# Patient Record
Sex: Female | Born: 1950 | Race: Black or African American | Hispanic: No | Smoking: Never smoker
Health system: Southern US, Community
[De-identification: ages and names within clinical notes are randomized; demographics above are authoritative.]

## PROBLEM LIST (undated history)

## (undated) DIAGNOSIS — I10 Essential (primary) hypertension: Secondary | ICD-10-CM

---

## 2006-10-20 DIAGNOSIS — K589 Irritable bowel syndrome without diarrhea: Secondary | ICD-10-CM | POA: Insufficient documentation

## 2006-10-20 HISTORY — DX: Irritable bowel syndrome without diarrhea: K58.9

## 2008-03-25 DIAGNOSIS — M545 Low back pain, unspecified: Secondary | ICD-10-CM

## 2008-03-25 HISTORY — DX: Low back pain, unspecified: M54.50

## 2008-11-12 DIAGNOSIS — K219 Gastro-esophageal reflux disease without esophagitis: Secondary | ICD-10-CM | POA: Diagnosis present

## 2008-11-12 HISTORY — DX: Gastro-esophageal reflux disease without esophagitis: K21.9

## 2009-11-17 DIAGNOSIS — M5126 Other intervertebral disc displacement, lumbar region: Secondary | ICD-10-CM | POA: Insufficient documentation

## 2009-12-25 DIAGNOSIS — K449 Diaphragmatic hernia without obstruction or gangrene: Secondary | ICD-10-CM | POA: Insufficient documentation

## 2009-12-25 HISTORY — DX: Diaphragmatic hernia without obstruction or gangrene: K44.9

## 2010-09-23 DIAGNOSIS — I10 Essential (primary) hypertension: Secondary | ICD-10-CM | POA: Diagnosis present

## 2010-09-23 HISTORY — DX: Essential (primary) hypertension: I10

## 2012-07-11 DIAGNOSIS — G4733 Obstructive sleep apnea (adult) (pediatric): Secondary | ICD-10-CM | POA: Diagnosis present

## 2012-07-11 HISTORY — DX: Obstructive sleep apnea (adult) (pediatric): G47.33

## 2012-08-23 DEATH — deceased

## 2013-10-30 DIAGNOSIS — R7303 Prediabetes: Secondary | ICD-10-CM | POA: Insufficient documentation

## 2013-10-30 DIAGNOSIS — R768 Other specified abnormal immunological findings in serum: Secondary | ICD-10-CM

## 2013-10-30 HISTORY — DX: Prediabetes: R73.03

## 2013-10-30 HISTORY — DX: Other specified abnormal immunological findings in serum: R76.8

## 2014-07-23 DIAGNOSIS — K3189 Other diseases of stomach and duodenum: Secondary | ICD-10-CM | POA: Insufficient documentation

## 2014-07-23 HISTORY — DX: Other diseases of stomach and duodenum: K31.89

## 2022-01-26 ENCOUNTER — Emergency Department (HOSPITAL_COMMUNITY): Payer: Medicare HMO

## 2022-01-26 ENCOUNTER — Encounter (HOSPITAL_COMMUNITY): Payer: Self-pay

## 2022-01-26 ENCOUNTER — Observation Stay (HOSPITAL_COMMUNITY)
Admission: EM | Admit: 2022-01-26 | Discharge: 2022-01-27 | Disposition: A | Discharge status: Home / Self Care | Payer: Medicare HMO | Attending: Internal Medicine | Admitting: Internal Medicine

## 2022-01-26 ENCOUNTER — Other Ambulatory Visit: Payer: Self-pay

## 2022-01-26 DIAGNOSIS — R291 Meningismus: Secondary | ICD-10-CM | POA: Diagnosis not present

## 2022-01-26 DIAGNOSIS — R058 Other specified cough: Secondary | ICD-10-CM | POA: Diagnosis not present

## 2022-01-26 DIAGNOSIS — I10 Essential (primary) hypertension: Secondary | ICD-10-CM | POA: Diagnosis not present

## 2022-01-26 DIAGNOSIS — K219 Gastro-esophageal reflux disease without esophagitis: Secondary | ICD-10-CM

## 2022-01-26 DIAGNOSIS — R519 Headache, unspecified: Secondary | ICD-10-CM | POA: Diagnosis present

## 2022-01-26 DIAGNOSIS — M5382 Other specified dorsopathies, cervical region: Secondary | ICD-10-CM | POA: Insufficient documentation

## 2022-01-26 DIAGNOSIS — E782 Mixed hyperlipidemia: Secondary | ICD-10-CM

## 2022-01-26 DIAGNOSIS — A419 Sepsis, unspecified organism: Secondary | ICD-10-CM | POA: Diagnosis not present

## 2022-01-26 DIAGNOSIS — R059 Cough, unspecified: Secondary | ICD-10-CM | POA: Insufficient documentation

## 2022-01-26 DIAGNOSIS — U071 COVID-19: Secondary | ICD-10-CM | POA: Diagnosis not present

## 2022-01-26 DIAGNOSIS — Z79899 Other long term (current) drug therapy: Secondary | ICD-10-CM | POA: Diagnosis not present

## 2022-01-26 DIAGNOSIS — G4733 Obstructive sleep apnea (adult) (pediatric): Secondary | ICD-10-CM

## 2022-01-26 DIAGNOSIS — R509 Fever, unspecified: Secondary | ICD-10-CM

## 2022-01-26 DIAGNOSIS — M436 Torticollis: Secondary | ICD-10-CM

## 2022-01-26 DIAGNOSIS — E876 Hypokalemia: Secondary | ICD-10-CM | POA: Diagnosis not present

## 2022-01-26 DIAGNOSIS — T464X5A Adverse effect of angiotensin-converting-enzyme inhibitors, initial encounter: Secondary | ICD-10-CM

## 2022-01-26 HISTORY — DX: Mixed hyperlipidemia: E78.2

## 2022-01-26 HISTORY — DX: Essential (primary) hypertension: I10

## 2022-01-26 LAB — URINALYSIS, ROUTINE W REFLEX MICROSCOPIC
Bacteria, UA: NONE SEEN
Bilirubin Urine: NEGATIVE
Glucose, UA: NEGATIVE mg/dL
Ketones, ur: NEGATIVE mg/dL
Leukocytes,Ua: NEGATIVE
Nitrite: NEGATIVE
Protein, ur: NEGATIVE mg/dL
Specific Gravity, Urine: 1.031 — ABNORMAL HIGH (ref 1.005–1.030)
pH: 5 (ref 5.0–8.0)

## 2022-01-26 LAB — APTT: aPTT: 32 seconds (ref 24–36)

## 2022-01-26 LAB — CBC WITH DIFFERENTIAL/PLATELET
Abs Immature Granulocytes: 0.03 10*3/uL (ref 0.00–0.07)
Basophils Absolute: 0 10*3/uL (ref 0.0–0.1)
Basophils Relative: 0 %
Eosinophils Absolute: 0 10*3/uL (ref 0.0–0.5)
Eosinophils Relative: 0 %
HCT: 39.5 % (ref 36.0–46.0)
Hemoglobin: 12.4 g/dL (ref 12.0–15.0)
Immature Granulocytes: 0 %
Lymphocytes Relative: 8 %
Lymphs Abs: 0.6 10*3/uL — ABNORMAL LOW (ref 0.7–4.0)
MCH: 26.7 pg (ref 26.0–34.0)
MCHC: 31.4 g/dL (ref 30.0–36.0)
MCV: 85.1 fL (ref 80.0–100.0)
Monocytes Absolute: 0.7 10*3/uL (ref 0.1–1.0)
Monocytes Relative: 10 %
Neutro Abs: 6.1 10*3/uL (ref 1.7–7.7)
Neutrophils Relative %: 82 %
Platelets: 221 10*3/uL (ref 150–400)
RBC: 4.64 MIL/uL (ref 3.87–5.11)
RDW: 14.5 % (ref 11.5–15.5)
WBC: 7.4 10*3/uL (ref 4.0–10.5)
nRBC: 0 % (ref 0.0–0.2)

## 2022-01-26 LAB — COMPREHENSIVE METABOLIC PANEL
ALT: 10 U/L (ref 0–44)
AST: 19 U/L (ref 15–41)
Albumin: 4 g/dL (ref 3.5–5.0)
Alkaline Phosphatase: 68 U/L (ref 38–126)
Anion gap: 12 (ref 5–15)
BUN: 8 mg/dL (ref 8–23)
CO2: 22 mmol/L (ref 22–32)
Calcium: 9.2 mg/dL (ref 8.9–10.3)
Chloride: 104 mmol/L (ref 98–111)
Creatinine, Ser: 0.97 mg/dL (ref 0.44–1.00)
GFR, Estimated: >60 mL/min (ref >60)
Glucose, Bld: 91 mg/dL (ref 70–99)
Potassium: 3.4 mmol/L — ABNORMAL LOW (ref 3.5–5.1)
Sodium: 138 mmol/L (ref 135–145)
Total Bilirubin: 0.9 mg/dL (ref 0.3–1.2)
Total Protein: 7.2 g/dL (ref 6.5–8.1)

## 2022-01-26 LAB — PROTIME-INR
INR: 1 (ref 0.8–1.2)
Prothrombin Time: 13 seconds (ref 11.4–15.2)

## 2022-01-26 LAB — LACTIC ACID, PLASMA: Lactic Acid, Venous: 1.8 mmol/L (ref 0.5–1.9)

## 2022-01-26 LAB — D-DIMER, QUANTITATIVE: D-Dimer, Quant: 0.9 ug/mL-FEU — ABNORMAL HIGH (ref 0.00–0.50)

## 2022-01-26 MED ORDER — POTASSIUM CHLORIDE CRYS ER 20 MEQ PO TBCR
40.0000 meq | EXTENDED_RELEASE_TABLET | Freq: Once | ORAL | Status: AC
  Administered 2022-01-26: 40 meq via ORAL
  Filled 2022-01-26: qty 2

## 2022-01-26 MED ORDER — ONDANSETRON HCL 4 MG/2ML IJ SOLN: 4.0000 mg | Freq: Four times a day (QID) | INTRAMUSCULAR | Status: DC | PRN

## 2022-01-26 MED ORDER — PANTOPRAZOLE SODIUM 40 MG PO TBEC: 40.0000 mg | DELAYED_RELEASE_TABLET | Freq: Every day | ORAL | Status: DC

## 2022-01-26 MED ORDER — SODIUM CHLORIDE 0.9 % IV SOLN
2.0000 g | INTRAVENOUS | Status: DC
  Administered 2022-01-26 – 2022-01-27 (×3): 2 g via INTRAVENOUS
  Filled 2022-01-26 (×8): qty 2000

## 2022-01-26 MED ORDER — LOSARTAN POTASSIUM 50 MG PO TABS: 25.0000 mg | ORAL_TABLET | Freq: Every day | ORAL | Status: DC

## 2022-01-26 MED ORDER — DEXTROSE 5 % IV SOLN
10.0000 mg/kg | Freq: Three times a day (TID) | INTRAVENOUS | Status: DC
  Administered 2022-01-27: 705 mg via INTRAVENOUS
  Filled 2022-01-26 (×3): qty 14.1

## 2022-01-26 MED ORDER — ENOXAPARIN SODIUM 40 MG/0.4ML IJ SOSY
40.0000 mg | PREFILLED_SYRINGE | INTRAMUSCULAR | Status: DC
  Administered 2022-01-26: 40 mg via SUBCUTANEOUS
  Filled 2022-01-26: qty 0.4

## 2022-01-26 MED ORDER — ACETAMINOPHEN 325 MG PO TABS: 650.0000 mg | ORAL_TABLET | Freq: Four times a day (QID) | ORAL | Status: DC | PRN

## 2022-01-26 MED ORDER — VANCOMYCIN HCL IN DEXTROSE 1-5 GM/200ML-% IV SOLN: 1000.0000 mg | Freq: Once | INTRAVENOUS | Status: DC

## 2022-01-26 MED ORDER — ACETAMINOPHEN 500 MG PO TABS
1000.0000 mg | ORAL_TABLET | Freq: Once | ORAL | Status: AC
  Administered 2022-01-26: 1000 mg via ORAL
  Filled 2022-01-26: qty 2

## 2022-01-26 MED ORDER — SODIUM CHLORIDE 0.9 % IV SOLN: 2.0000 g | Freq: Once | INTRAVENOUS | Status: DC

## 2022-01-26 MED ORDER — ROSUVASTATIN CALCIUM 20 MG PO TABS: 20.0000 mg | ORAL_TABLET | Freq: Every day | ORAL | Status: DC

## 2022-01-26 MED ORDER — VANCOMYCIN HCL 750 MG/150ML IV SOLN
750.0000 mg | Freq: Two times a day (BID) | INTRAVENOUS | Status: DC
  Filled 2022-01-26 (×2): qty 150

## 2022-01-26 MED ORDER — DEXAMETHASONE SODIUM PHOSPHATE 10 MG/ML IJ SOLN
10.0000 mg | Freq: Once | INTRAMUSCULAR | Status: AC
Start: 2022-01-26 — End: 2022-01-26
  Administered 2022-01-26: 10 mg via INTRAVENOUS
  Filled 2022-01-26: qty 1

## 2022-01-26 MED ORDER — HYDRALAZINE HCL 20 MG/ML IJ SOLN: 10.0000 mg | Freq: Four times a day (QID) | INTRAMUSCULAR | Status: DC | PRN

## 2022-01-26 MED ORDER — VANCOMYCIN HCL 2000 MG/400ML IV SOLN
2000.0000 mg | Freq: Once | INTRAVENOUS | Status: AC
  Administered 2022-01-26: 2000 mg via INTRAVENOUS
  Filled 2022-01-26: qty 400

## 2022-01-26 MED ORDER — SODIUM CHLORIDE 0.9 % IV SOLN
2.0000 g | Freq: Once | INTRAVENOUS | Status: AC
  Administered 2022-01-26: 2 g via INTRAVENOUS
  Filled 2022-01-26: qty 20

## 2022-01-26 MED ORDER — LACTATED RINGERS IV BOLUS (SEPSIS)
1000.0000 mL | Freq: Once | INTRAVENOUS | Status: AC
Start: 2022-01-26 — End: 2022-01-26
  Administered 2022-01-26: 1000 mL via INTRAVENOUS

## 2022-01-26 MED ORDER — VANCOMYCIN HCL IN DEXTROSE 1-5 GM/200ML-% IV SOLN
1000.0000 mg | Freq: Once | INTRAVENOUS | Status: DC
Start: 2022-01-26 — End: 2022-01-26

## 2022-01-26 MED ORDER — LACTATED RINGERS IV BOLUS (SEPSIS)
1000.0000 mL | Freq: Once | INTRAVENOUS | Status: AC
  Administered 2022-01-26: 1000 mL via INTRAVENOUS

## 2022-01-26 MED ORDER — LACTATED RINGERS IV SOLN: INTRAVENOUS | Status: AC

## 2022-01-26 MED ORDER — ACETAMINOPHEN 650 MG RE SUPP: 650.0000 mg | Freq: Four times a day (QID) | RECTAL | Status: DC | PRN

## 2022-01-26 MED ORDER — IOHEXOL 350 MG/ML SOLN
65.0000 mL | Freq: Once | INTRAVENOUS | Status: AC | PRN
  Administered 2022-01-26: 65 mL via INTRAVENOUS

## 2022-01-26 MED ORDER — LACTATED RINGERS IV SOLN: INTRAVENOUS | Status: DC

## 2022-01-26 MED ORDER — SODIUM CHLORIDE 0.9 % IV SOLN
2.0000 g | Freq: Two times a day (BID) | INTRAVENOUS | Status: DC
  Administered 2022-01-27: 2 g via INTRAVENOUS
  Filled 2022-01-26: qty 20

## 2022-01-26 MED ORDER — ONDANSETRON HCL 4 MG PO TABS: 4.0000 mg | ORAL_TABLET | Freq: Four times a day (QID) | ORAL | Status: DC | PRN

## 2022-01-26 MED ORDER — ACYCLOVIR SODIUM 50 MG/ML IV SOLN
10.0000 mg/kg | Freq: Once | INTRAVENOUS | Status: AC
Start: 2022-01-26 — End: 2022-01-26
  Administered 2022-01-26: 705 mg via INTRAVENOUS
  Filled 2022-01-26: qty 14.1

## 2022-01-26 MED ORDER — POLYETHYLENE GLYCOL 3350 17 G PO PACK: 17.0000 g | PACK | Freq: Every day | ORAL | Status: DC | PRN

## 2022-01-26 MED ORDER — LISINOPRIL 10 MG PO TABS: 10.0000 mg | ORAL_TABLET | Freq: Every day | ORAL | Status: DC

## 2022-01-26 NOTE — Assessment & Plan Note (Signed)
   Patient presenting with 24-hour history of waxing waning headaches neck pain and stiffness  Patient additionally presenting with chills with identified fever on arrival to the emergency department  Not being said, Kernig's and Brudzinski's sign's are negative and patient exhibits no evidence of photophobia lethargy or confusion on exam.  Mixed picture.  Overall while meningitis is possible her overall presentation is not extremely compelling.  Patient could alternatively simply have a viral syndrome.    ER provider has appropriately initiated intravenous antibiotics due to concerns for possible bacterial meningitis and intended to do a lumbar puncture however patient has adamantly refused and has repeated this refusal when I spoke to her.  Considering patient's refusal, we will continue empiric antibiotic therapy for now  Monitoring surveillance blood cultures  Continuing intravenous fluids  If patient has negative blood cultures for 48 hours antibiotics can likely be discontinued.  Patient may benefit from evaluation by infectious disease as well to assist in ruling in or out possibility of meningitis

## 2022-01-26 NOTE — Assessment & Plan Note (Signed)
   Longstanding history of OSA per review of records however patient is not on CPAP  As needed supplemental oxygen with sleep

## 2022-01-26 NOTE — Assessment & Plan Note (Addendum)
.   Please see comments about discontinuation of lisinopril and initiation of losartan above . Titrate antihypertensive regimen as necessary to achieve adequate BP control . PRN intravenous antihypertensives for excessively elevated blood pressure

## 2022-01-26 NOTE — Assessment & Plan Note (Signed)
   Patient complaining of a 4-week history of dry nonproductive cough that started around the time that she was restarted on lisinopril  In fact, patient presented to an outpatient provider approximately 2 and half weeks ago in [place] and was placed on a course of azithromycin which resulted in no improvement in her symptoms  Chest x-ray and CT angiogram of the chest revealed no evidence of pneumonia  After lengthy discussion with the patient she has recalled that she was placed on lisinopril many years ago after which it was discontinued and she was switched to losartan after it was established that she had ACE inhibitor induced cough.  Somehow she was recently placed on lisinopril once again.  We will discontinue lisinopril and place patient on losartan 25 mg daily starting tomorrow

## 2022-01-26 NOTE — Sepsis Progress Note (Signed)
Code Sepsis protocol being monitored by [employer]. 

## 2022-01-26 NOTE — Progress Notes (Signed)
Melissa Ward a 71 y.o. female admitted on 01/26/2022 with headaches and neck pain with concern for meningitis.  Dexamethasone 10 mg x1 administered in ED. Pharmacy has been consulted for vancomycin/ampicillin/ceftriaxone/acyclovir dosing d/t concern for meningitis .  Will use trough-based dosing for Vancomycin d/t meningitis indication.   6/6: Tm 103.1, BP elevated, tachycardic, Scr 0.97 (BL unknown), WBC 7.4   Plan: GIVE Vancomycin 2,000 mg IV x1 (Wt used: 90.7 kg)  THEN Vancomycin 750 mg IV Q12H (Scr used: 0.97, trough-based)  START Ampicillin 2g IV Q4H  START Ceftriaxone 2g IV Q12H  START acyclovir 705 mg Q8H (dosed based on adj bodyweight 70.5 kg)- Continue IVF while acyclovir is running  Monitor renal function, clinical status, de-escalation, C/S, levels as indicated    Antimicrobials this admission: vancomycin 01/26/2022>>  Ampicillin 6/6>> Ceftriaxone 6/6>> Acyclovir 6/6>>  Microbiology results: 6/6 Bcx: sent  Jani Gravel, PharmD PGY-1 Acute Care Resident  01/26/2022 5:42 PM

## 2022-01-26 NOTE — ED Triage Notes (Signed)
Pt arrived POV from home c/o neck pain and a headache that started last night. Pt also endorses a dry cough. Pt also states she has chills.

## 2022-01-26 NOTE — Assessment & Plan Note (Signed)
·   Please see assessment and plan above °

## 2022-01-26 NOTE — H&P (Addendum)
History and Physical    Patient: Melissa Ward MRN: [id number] DOA: 01/26/2022  Date of Service: the patient was seen and examined on 01/26/2022  Patient coming from: Home  Chief Complaint:  Chief Complaint  Patient presents with   Neck Pain   Headache    HPI:   71 year old female with past medical history of gastroesophageal reflux disease, hypertension, hyperlipidemia, hypertension, obstructive sleep apnea not on CPAP who presents to [facility] emergency department with complaints of neck pain and headache.  Patient explains that yesterday evening she suddenly began to experience a headache.  This headache was severe in intensity, generalized in location, sharp in quality without alleviating or exacerbating factors.  Patient's severe headache continued to persist overnight.  She additionally reports associated neck pain and neck stiffness over the span of time.   Patient endorses chills but denies any previous fevers.  Patient denies any sick contacts, recent travel, focal weakness slurred speech or loss of balance.  Patient does report a 4-week history of dry nonproductive cough for which she was placed on a course of azithromycin in late May with no improvement in symptoms.  Due to persisting symptoms overnight the patient eventually presented to [facility] emergency department for evaluation.  Upon evaluation in the emergency department due to clinical concern for possible meningitis patient was initiated on intravenous ceftriaxone, vancomycin, ampicillin and acyclovir.  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  Patient refused to proceed with lumbar puncture however stated that they did not think they needed it.  ER provider [facility] evaluate patient for admission to the hospital.  Review of Systems: Review of Systems   Constitutional:  Positive for chills.  Musculoskeletal:  Positive for neck pain.  Neurological:  Positive for headaches.    Past Medical History:  Diagnosis Date   ANA positive 10/30/2013   Essential hypertension 09/23/2010   Gastric nodule 07/23/2014   GERD without esophagitis 11/12/2008   Formatting of this note might be different from the original. [employer] Description: Esophageal Reflux  Problem Comments: EGD 12/17/09:Chronic gastritis  (Created by Conversion)   Hiatal hernia 12/25/2009   Formatting of this note might be different from the original. [employer] Description: Hiatal Hernia  Problem Comments: EGD 12/17/09:Chronic gastritis  (Created by Conversion)   Hypertension    Irritable bowel syndrome without diarrhea 10/20/2006   Formatting of this note might be different from the original. [employer] Description: Irritable Bowel Syndrome  Problem Comments: with constipation  (Created by Conversion)   Low back pain 03/25/2008   Formatting of this note might be different from the original. [employer] Description: Lower Back Pain  Problem Comments: - stable at present  (Created by Conversion)   Mixed hyperlipidemia 01/26/2022   Obstructive sleep apnea 07/11/2012   Formatting of this note might be different from the original. [employer] Description: Obstructive Sleep Apnea  (Created by Conversion)   Prediabetes 10/30/2013    History reviewed. No pertinent surgical history.  Social History:  reports that she has never smoked. She has never used smokeless tobacco. No history on file for alcohol use and drug use.  No Known Allergies  History reviewed. No pertinent family history.  Prior to Admission medications   Not on File    Physical Exam:  Vitals:   01/26/22 2015 01/26/22 2030 01/26/22 2045 01/26/22 2200  BP: 138/83 132/74 (!) 145/80 139/75  Pulse:  83 84 89 92  Resp: 18 (!) 25 (!) 21 10  Temp:      TempSrc:      SpO2: 95% 91% 90% 95%  Weight:      Height:         Constitutional: Awake alert and oriented x3, no associated distress.   Skin: no rashes, no lesions, good skin turgor noted. Eyes: Pupils are equally reactive to light.  No evidence of scleral icterus or conjunctival pallor.  ENMT: Moist mucous membranes noted.  Posterior pharynx clear of any exudate or lesions.   Neck: normal, supple, no masses, no thyromegaly.  No evidence of jugular venous distension.   Respiratory: clear to auscultation bilaterally, no wheezing, no crackles. Normal respiratory effort. No accessory muscle use.  Cardiovascular: Regular rate and rhythm, no murmurs / rubs / gallops. No extremity edema. 2+ pedal pulses. No carotid bruits.  Chest:   Nontender without crepitus or deformity.   Back:   Nontender without crepitus or deformity. Abdomen: Abdomen is soft and nontender.  No evidence of intra-abdominal masses.  Positive bowel sounds noted in all quadrants.   Musculoskeletal: No joint deformity upper and lower extremities. Good ROM, no contractures. Normal muscle tone.  Neurologic: CN 2-12 grossly intact. Sensation intact.  Patient moving all 4 extremities spontaneously.  Patient is following all commands.  Patient is responsive to verbal stimuli.   Psychiatric: Patient exhibits normal mood with appropriate affect.  Patient seems to possess insight as to their current situation.    Data Reviewed:  I have personally reviewed and interpreted labs, imaging.  Significant findings are:  CBC reveals white blood cell count of 7.4, hemoglobin 12.4, hematocrit 39.5 and platelet count of 221. Lactic acid 1.8 Chemistry revealing sodium 138, potassium 3.4, chloride 104, BUN 8, creatinine 0.97. D-dimer 0.90. Chest x-ray personally reviewed revealing no evidence of acute cardiopulmonary disease.  EKG: Personally reviewed.  Rhythm is sinus tachycardia with heart rate of 101 bpm.  QTc 496 ms.  No dynamic ST segment changes appreciated.   Assessment and Plan: *  Meningismus Patient presenting with 24-hour history of waxing waning headaches neck pain and stiffness Patient additionally presenting with chills with identified fever on arrival to the emergency department Not being said, Kernig's and Brudzinski's sign's are negative and patient exhibits no evidence of photophobia lethargy or confusion on exam. Mixed picture.  Overall while meningitis is possible her overall presentation is not extremely compelling.  Patient could alternatively simply have a viral syndrome.   ER provider has appropriately initiated intravenous antibiotics due to concerns for possible bacterial meningitis and intended to do a lumbar puncture however patient has adamantly refused and has repeated this refusal when I spoke to her. Considering patient's refusal, we will continue empiric antibiotic therapy for now Monitoring surveillance blood cultures Continuing intravenous fluids If patient has negative blood cultures for 48 hours antibiotics can likely be discontinued.  Patient may benefit from evaluation by infectious disease as well to assist in ruling in or out possibility of meningitis  Fever and chills Please see assessment and plan above  Cough due to ACE inhibitor Patient complaining of a 4-week history of dry nonproductive cough that started around the time that she was restarted on lisinopril In fact, patient presented to an outpatient provider approximately 2 and half weeks ago in [place] and was placed on a course of azithromycin which resulted in no improvement in her symptoms Chest x-ray and CT angiogram of the chest revealed no evidence of pneumonia  After lengthy discussion with the patient she has recalled that she was placed on lisinopril many years ago after which it was discontinued and she was switched to losartan after it was established that she had ACE inhibitor induced cough.  Somehow she was recently placed on lisinopril once again. We will discontinue  lisinopril and place patient on losartan 25 mg daily starting tomorrow  Hypokalemia Replacing with potassium chloride Evaluating for concurrent hypomagnesemia  Monitoring potassium levels with serial chemistries.   Essential hypertension Please see comments about discontinuation of lisinopril and initiation of losartan above Titrate antihypertensive regimen as necessary to achieve adequate BP control PRN intravenous antihypertensives for excessively elevated blood pressure    Mixed hyperlipidemia Continuing home regimen of lipid lowering therapy.   Obstructive sleep apnea Longstanding history of OSA per review of records however patient is not on CPAP As needed supplemental oxygen with sleep  GERD without esophagitis Continuing home regimen of daily PPI therapy.        Code Status:  Full code  code status decision has been confirmed with: patient Family Communication: daugther at bedside who has been updated on plan of care.    Consults: None  Severity of Illness:  The appropriate patient status for this patient is OBSERVATION. Observation status is judged to be reasonable and necessary in order to provide the required intensity of service to ensure the patient's safety. The patient's presenting symptoms, physical exam findings, and initial radiographic and laboratory data in the context of their medical condition is felt to place them at decreased risk for further clinical deterioration. Furthermore, it is anticipated that the patient will be medically stable for discharge from the hospital within 2 midnights of admission.   Author:  Vernelle Emerald MD  01/26/2022 11:29 PM

## 2022-01-26 NOTE — ED Provider Notes (Signed)
[facility] Provider Note   CSN: [id number] Arrival date & time: 01/26/22  1628     History  Chief Complaint  Patient presents with   Neck Pain   Headache    Melissa BridegroomLinda Ward is a 71 y.o. female.   Neck Pain Associated symptoms: headaches   Headache Associated symptoms: neck pain    71 year old female presenting to the emergency department with sudden onset thunderclap headache, neck pain and fever.  The patient states that she was at home last night when she had sudden onset and maximal onset headache.  She also endorsed subsequent neck pain and stiffness.  She has had a dry cough and endorses chills.  She denies any chest pain.  She denies any blurry vision, facial numbness or weakness, extremity numbness or weakness.  She arrived to the emergency department GCS 15, AAOx3, at her baseline mental status.  She denies any sick contacts.  Home Medications Prior to Admission medications   Medication Sig Start Date End Date Taking? Authorizing Provider  azithromycin (ZITHROMAX) 500 MG tablet Take by mouth. 01/07/22   [provider]  clobetasol cream (TEMOVATE) 0.05 % Apply topically. 12/15/21   [provider]  lisinopril (ZESTRIL) 10 MG tablet Take by mouth. 11/16/21   [provider]  omeprazole (PRILOSEC) 40 MG capsule Take by mouth. 01/21/22   [provider]  predniSONE (DELTASONE) 20 MG tablet Take by mouth. 12/07/21   [provider]  rosuvastatin (CRESTOR) 20 MG tablet Take by mouth. 09/08/21   [provider]  triamcinolone cream (KENALOG) 0.1 % Apply topically. 12/17/21   [provider]  valACYclovir (VALTREX) 1000 MG tablet Take by mouth. 12/07/21   [provider]      Allergies    Patient has no known allergies.    Review of Systems   Review of Systems  Musculoskeletal:  Positive for neck pain.  Neurological:  Positive for headaches.  All other systems reviewed and  are negative.  Physical Exam Updated Vital Signs BP 139/75   Pulse 92   Temp (!) 103.1 F (39.5 C) (Oral)   Resp 10   Ht 5\' 5"  (1.651 m)   Wt 90.7 kg   SpO2 95%   BMI 33.28 kg/m  Physical Exam Vitals and nursing note reviewed.  Constitutional:      General: She is not in acute distress.    Appearance: She is well-developed.  HENT:     Head: Normocephalic and atraumatic.  Eyes:     Conjunctiva/sclera: Conjunctivae normal.  Neck:     Comments: Nuchal rigidity with pain at attempts at passive and active ROM Cardiovascular:     Rate and Rhythm: Normal rate and regular rhythm.  Pulmonary:     Effort: Pulmonary effort is normal. No respiratory distress.     Breath sounds: Normal breath sounds.  Abdominal:     Palpations: Abdomen is soft.     Tenderness: There is no abdominal tenderness.  Musculoskeletal:        General: No swelling.     Cervical back: Rigidity present. Pain with movement and muscular tenderness present. Decreased range of motion.  Skin:    General: Skin is warm and dry.     Capillary Refill: Capillary refill takes less than 2 seconds.  Neurological:     Mental Status: She is alert and oriented to person, place, and time.     GCS: GCS eye subscore is 4. GCS verbal subscore is 5.  GCS motor subscore is 6.     Cranial Nerves: No cranial nerve deficit, dysarthria or facial asymmetry.     Sensory: No sensory deficit.     Motor: No weakness.  Psychiatric:        Mood and Affect: Mood normal.    ED Results / Procedures / Treatments   Labs (all labs ordered are listed, but only abnormal results are displayed) Labs Reviewed  CBC WITH DIFFERENTIAL/PLATELET - Abnormal; Notable for the following components:      Result Value   Lymphs Abs 0.6 (*)    All other components within normal limits  COMPREHENSIVE METABOLIC PANEL - Abnormal; Notable for the following components:   Potassium 3.4 (*)    All other components within normal limits  D-DIMER, QUANTITATIVE -  Abnormal; Notable for the following components:   D-Dimer, Quant 0.90 (*)    All other components within normal limits  RESP PANEL BY RT-PCR (FLU A&B, COVID) [id number]  CULTURE, BLOOD (ROUTINE X 2)  CULTURE, BLOOD (ROUTINE X 2)  URINE CULTURE  LACTIC ACID, PLASMA  PROTIME-INR  APTT  URINALYSIS, ROUTINE W REFLEX MICROSCOPIC  COMPREHENSIVE METABOLIC PANEL  MAGNESIUM  CBC WITH DIFFERENTIAL/PLATELET    EKG EKG Interpretation  Date/Time:  Tuesday January 26 2022 18:25:47 EDT Ventricular Rate:  101 PR Interval:  180 QRS Duration: 79 QT Interval:  382 QTC Calculation: 496 R Axis:   -29 Text Interpretation: Sinus tachycardia Right atrial enlargement Borderline left axis deviation Abnormal R-wave progression, early transition Nonspecific T abnrm, anterolateral leads Borderline prolonged QT interval Confirmed by Ernie Avena ([id number]) on 01/26/2022 6:51:30 PM  Radiology CT HEAD WO CONTRAST ( )  Result Date: 01/26/2022 CLINICAL DATA:  Headache, sudden, severe EXAM: CT HEAD WITHOUT CONTRAST TECHNIQUE: Contiguous axial images were obtained from the base of the skull through the vertex without intravenous contrast. RADIATION DOSE REDUCTION: This exam was performed according to the departmental dose-optimization program which includes automated exposure control, adjustment of the mA and/or kV according to patient size and/or use of iterative reconstruction technique. COMPARISON:  None Available. FINDINGS: Brain: Brain volume is normal for age. No intracranial hemorrhage, mass effect, or midline shift. No hydrocephalus. The basilar cisterns are patent. Remote lacunar infarct in the right basal ganglia. Mild periventricular white matter hypodensity typical of chronic small vessel ischemia. No evidence of territorial infarct or acute ischemia. No extra-axial or intracranial fluid collection. Vascular: Atherosclerosis of skullbase vasculature without hyperdense vessel or abnormal calcification. Skull: No fracture  or focal lesion. Sinuses/Orbits: Paranasal sinuses and mastoid air cells are clear. The visualized orbits are unremarkable. Other: None. IMPRESSION: 1. No acute intracranial abnormality. 2. Remote lacunar infarct in the right basal ganglia. Mild chronic small vessel ischemia. Electronically Signed   By: Narda Rutherford M.D.   On: 01/26/2022 19:40   CT Angio Chest PE W and/or Wo Contrast  Result Date: 01/26/2022 CLINICAL DATA:  Chest pain and elevated D-dimer, initial encounter EXAM: CT ANGIOGRAPHY CHEST WITH CONTRAST TECHNIQUE: Multidetector CT imaging of the chest was performed using the standard protocol during bolus administration of intravenous contrast. Multiplanar CT image reconstructions and MIPs were obtained to evaluate the vascular anatomy. RADIATION DOSE REDUCTION: This exam was performed according to the departmental dose-optimization program which includes automated exposure control, adjustment of the mA and/or kV according to patient size and/or use of iterative reconstruction technique. CONTRAST:  65mL OMNIPAQUE IOHEXOL 350 MG/ML SOLN COMPARISON:  Chest x-ray from earlier in the same day. FINDINGS: Cardiovascular: Thoracic aorta and its branches  are within normal limits. No dissection or aneurysmal dilatation is seen. No cardiac enlargement is noted. No coronary calcifications are seen. The pulmonary artery shows a normal branching pattern bilaterally. No filling defect to suggest pulmonary embolism is noted. Mediastinum/Nodes: Thoracic inlet is within normal limits. No hilar or mediastinal adenopathy is noted. The esophagus as visualized is within normal limits. Lungs/Pleura: Lungs are well aerated bilaterally. No focal infiltrate or sizable effusion is seen. Upper Abdomen: Gallbladder has been surgically removed. The remainder of the upper abdomen appears within normal limits. Musculoskeletal: Degenerative changes of the thoracic spine are noted. Review of the MIP images confirms the above  findings. IMPRESSION: No evidence of pulmonary emboli. No acute abnormality seen. Electronically Signed   By: Alcide Clever M.D.   On: 01/26/2022 21:44   DG Chest Portable 1 View  Result Date: 01/26/2022 CLINICAL DATA:  cough EXAM: PORTABLE CHEST 1 VIEW COMPARISON:  None Available. FINDINGS: Prominent cardiac silhouette likely due to AP portable technique. The heart and mediastinal contours are within normal limits. No focal consolidation. No pulmonary edema. No pleural effusion. No pneumothorax. No acute osseous abnormality. IMPRESSION: No active disease. Electronically Signed   By: Tish Frederickson M.D.   On: 01/26/2022 17:36    Procedures .Critical Care Performed by: Ernie Avena, MD Authorized by: Ernie Avena, MD   Critical care provider statement:    Critical care time (minutes):  30   Critical care was necessary to treat or prevent imminent or life-threatening deterioration of the following conditions:  Sepsis   Critical care was time spent personally by me on the following activities:  Development of treatment plan with patient or surrogate, discussions with consultants, evaluation of patient's response to treatment, examination of patient, ordering and review of laboratory studies, ordering and review of radiographic studies, ordering and performing treatments and interventions, pulse oximetry, re-evaluation of patient's condition and review of old charts   Care discussed with: admitting provider      Medications Ordered in ED Medications  ampicillin (OMNIPEN) 2 g in sodium chloride 0.9 % 100 mL IVPB (0 g Intravenous Stopped 01/26/22 2033)  cefTRIAXone (ROCEPHIN) 2 g in sodium chloride 0.9 % 100 mL IVPB (has no administration in time range)  acyclovir (ZOVIRAX) 705 mg in dextrose 5 % 250 mL IVPB (has no administration in time range)  vancomycin (VANCOREADY) IVPB 750 mg/150 mL (has no administration in time range)  lactated ringers infusion (has no administration in time range)   lisinopril (ZESTRIL) tablet 10 mg (has no administration in time range)  pantoprazole (PROTONIX) EC tablet 40 mg (has no administration in time range)  rosuvastatin (CRESTOR) tablet 20 mg (has no administration in time range)  enoxaparin (LOVENOX) injection 40 mg (has no administration in time range)  acetaminophen (TYLENOL) tablet 650 mg (has no administration in time range)    Or  acetaminophen (TYLENOL) suppository 650 mg (has no administration in time range)  polyethylene glycol (MIRALAX / GLYCOLAX) packet 17 g (has no administration in time range)  ondansetron (ZOFRAN) tablet 4 mg (has no administration in time range)    Or  ondansetron (ZOFRAN) injection 4 mg (has no administration in time range)  potassium chloride SA (KLOR-CON M) CR tablet 40 mEq (has no administration in time range)  hydrALAZINE (APRESOLINE) injection 10 mg (has no administration in time range)  acetaminophen (TYLENOL) tablet 1,000 mg (1,000 mg Oral Given 01/26/22 1810)  lactated ringers bolus 1,000 mL (1,000 mLs Intravenous New Bag/Given 01/26/22 2036)    And  lactated ringers bolus 1,000 mL (0 mLs Intravenous Stopped 01/26/22 2114)  dexamethasone (DECADRON) injection 10 mg (10 mg Intravenous Given 01/26/22 1811)  cefTRIAXone (ROCEPHIN) 2 g in sodium chloride 0.9 % 100 mL IVPB (0 g Intravenous Stopped 01/26/22 1949)  acyclovir (ZOVIRAX) 705 mg in dextrose 5 % 250 mL IVPB (0 mg Intravenous Stopped 01/26/22 1949)  vancomycin (VANCOREADY) IVPB 2000 mg/400 mL (2,000 mg Intravenous New Bag/Given 01/26/22 1948)  iohexol (OMNIPAQUE) 350 MG/ML injection 65 mL (65 mLs Intravenous Contrast Given 01/26/22 2141)    ED Course/ Medical Decision Making/ A&P Clinical Course as of 01/26/22 2216  Tue Jan 26, 2022  1718 Pulse Rate(!): 114 [JL]  1718 Temp(!): 103.1 F (39.5 C) [JL]  1725 Pulse Rate(!): 114 [JL]  1725 Temp(!): 103.1 F (39.5 C) [JL]  1958 D-Dimer, Quant(!): 0.90 [JL]    Clinical Course User Index [JL] Ernie Avena, MD                            Medical Decision Making Amount and/or Complexity of Data Reviewed Labs: ordered. Decision-making details documented in ED Course. Radiology: ordered. ECG/medicine tests: ordered.  Risk OTC drugs. Prescription drug management. Decision regarding hospitalization.   71 year old female presenting to the emergency department with sudden onset thunderclap headache, neck pain and fever.  The patient states that she was at home last night when she had sudden onset and maximal onset headache.  She also endorsed subsequent neck pain and stiffness.  She has had a dry cough and endorses chills.  She denies any chest pain.  She denies any blurry vision, facial numbness or weakness, extremity numbness or weakness.  She arrived to the emergency department GCS 15, AAOx3, at her baseline mental status.  She denies any sick contacts.  On arrival, the patient was febrile to 103.1, tachycardic P114, hypertensive BP 169/119, not tachypneic, not hypoxic saturating 97% on room air.  Sinus cardia noted on cardiac telemetry.  Concern for sepsis on arrival and code sepsis was initiated.  The patient was administered a 2 L IV fluid bolus, administered 1 g Tylenol for fever, covered with broad-spectrum antivirals and antibiotics.  Differential diagnosis includes meningitis/encephalitis, subarachnoid hemorrhage, pneumonia, sepsis from other source such as genitourinary source, viral syndrome.  I discussed my concern for developing bacterial meningitis versus subarachnoid hemorrhage as the patient did present with a thunderclap headache and also has nuchal rigidity on exam and is febrile.  She is not particularly altered.   Discussed the risks and benefits of lumbar puncture with the patient.  She declined a lumbar puncture at this time.  I explained to the patient that I would not be able to rule out subarachnoid hemorrhage or meningitis/encephalitis without a lumbar puncture.  The patient still  declined lumbar puncture at this time.  I believe she has capacity to make this decision.  We will go ahead and treat for meningitis/encephalitis and admit the patient for further observation and treatment of sepsis of unclear etiology.  Laboratory work-up interpreted by myself significant for lactic acid is normal at 1.8, CMP generally unremarkable with mild hypokalemia to 3.4, D-dimer elevated to 0.9, CBC without a leukocytosis or anemia, urinalysis and urine culture pending, blood cultures x2 pending.  Chest x-ray was performed, interpreted by myself and radiology and negative for focal airspace disease.  CT head was negative for acute intracranial abnormality.  CTA imaging given the patient's elevated D-dimer and reported dry cough was performed and  resulted negative for focal airspace disease or evidence of PE.  Dr. Leafy Half of hospitalist medicine was consulted and accepted the patient in admission.    Final Clinical Impression(s) / ED Diagnoses Final diagnoses:  Sepsis, due to unspecified organism, unspecified whether acute organ dysfunction present [facility])  Acute intractable headache, unspecified headache type  Neck rigidity  Fever, unspecified fever cause    Rx / DC Orders ED Discharge Orders     None         Ernie Avena, MD 01/26/22 2216

## 2022-01-26 NOTE — Assessment & Plan Note (Signed)
·   Replacing with potassium chloride °· Evaluating for concurrent hypomagnesemia  °· Monitoring potassium levels with serial chemistries. ° °

## 2022-01-26 NOTE — Assessment & Plan Note (Signed)
.   Continuing home regimen of lipid lowering therapy.  

## 2022-01-26 NOTE — ED Notes (Signed)
Patient transported to CT 

## 2022-01-26 NOTE — Assessment & Plan Note (Signed)
Continuing home regimen of daily PPI therapy.  

## 2022-01-27 ENCOUNTER — Other Ambulatory Visit (HOSPITAL_COMMUNITY): Payer: Self-pay

## 2022-01-27 DIAGNOSIS — R291 Meningismus: Secondary | ICD-10-CM | POA: Diagnosis not present

## 2022-01-27 LAB — RESP PANEL BY RT-PCR (FLU A&B, COVID) ARPGX2
Influenza A by PCR: NEGATIVE
Influenza B by PCR: NEGATIVE
SARS Coronavirus 2 by RT PCR: POSITIVE — AB

## 2022-01-27 LAB — COMPREHENSIVE METABOLIC PANEL
ALT: 11 U/L (ref 0–44)
AST: 17 U/L (ref 15–41)
Albumin: 3.3 g/dL — ABNORMAL LOW (ref 3.5–5.0)
Alkaline Phosphatase: 57 U/L (ref 38–126)
Anion gap: 9 (ref 5–15)
BUN: 12 mg/dL (ref 8–23)
CO2: 25 mmol/L (ref 22–32)
Calcium: 9 mg/dL (ref 8.9–10.3)
Chloride: 103 mmol/L (ref 98–111)
Creatinine, Ser: 0.91 mg/dL (ref 0.44–1.00)
GFR, Estimated: >60 mL/min (ref >60)
Glucose, Bld: 166 mg/dL — ABNORMAL HIGH (ref 70–99)
Potassium: 3.5 mmol/L (ref 3.5–5.1)
Sodium: 137 mmol/L (ref 135–145)
Total Bilirubin: 0.7 mg/dL (ref 0.3–1.2)
Total Protein: 6.1 g/dL — ABNORMAL LOW (ref 6.5–8.1)

## 2022-01-27 LAB — CBC WITH DIFFERENTIAL/PLATELET
Abs Immature Granulocytes: 0.04 10*3/uL (ref 0.00–0.07)
Basophils Absolute: 0 10*3/uL (ref 0.0–0.1)
Basophils Relative: 0 %
Eosinophils Absolute: 0 10*3/uL (ref 0.0–0.5)
Eosinophils Relative: 0 %
HCT: 34.4 % — ABNORMAL LOW (ref 36.0–46.0)
Hemoglobin: 10.9 g/dL — ABNORMAL LOW (ref 12.0–15.0)
Immature Granulocytes: 1 %
Lymphocytes Relative: 5 %
Lymphs Abs: 0.3 10*3/uL — ABNORMAL LOW (ref 0.7–4.0)
MCH: 26.6 pg (ref 26.0–34.0)
MCHC: 31.7 g/dL (ref 30.0–36.0)
MCV: 83.9 fL (ref 80.0–100.0)
Monocytes Absolute: 0.2 10*3/uL (ref 0.1–1.0)
Monocytes Relative: 3 %
Neutro Abs: 5.6 10*3/uL (ref 1.7–7.7)
Neutrophils Relative %: 91 %
Platelets: 189 10*3/uL (ref 150–400)
RBC: 4.1 MIL/uL (ref 3.87–5.11)
RDW: 14.5 % (ref 11.5–15.5)
WBC: 6.2 10*3/uL (ref 4.0–10.5)
nRBC: 0 % (ref 0.0–0.2)

## 2022-01-27 LAB — MAGNESIUM: Magnesium: 1.7 mg/dL (ref 1.7–2.4)

## 2022-01-27 LAB — C-REACTIVE PROTEIN: CRP: 2.3 mg/dL — ABNORMAL HIGH (ref <1.0)

## 2022-01-27 LAB — MRSA NEXT GEN BY PCR, NASAL: MRSA by PCR Next Gen: NOT DETECTED

## 2022-01-27 LAB — PROCALCITONIN: Procalcitonin: <0.1 ng/mL

## 2022-01-27 MED ORDER — LOSARTAN POTASSIUM 25 MG PO TABS
25.0000 mg | ORAL_TABLET | Freq: Every day | ORAL | 0 refills | Status: AC
Start: 2022-01-27 — End: 2022-02-26
  Filled 2022-01-27: qty 30, 30d supply, fill #0

## 2022-01-27 NOTE — ED Notes (Signed)
Daughter Gladstone Lighter [phone] would like an update immediately

## 2022-01-27 NOTE — Progress Notes (Signed)
PT Cancellation Note  Patient Details Name: Adana Pedro MRN: [id number] DOB: 1951-08-05   Cancelled Treatment:    Reason Eval/Treat Not Completed: PT screened, no needs identified, will sign off Per OT, no skilled PT needs. Will sign off. If needs change, please re-consult.   Lou Miner, DPT  Acute Rehabilitation Services  Office: [phone]    Rudean Hitt 01/27/2022, 9:47 AM

## 2022-01-27 NOTE — Discharge Summary (Signed)
Physician Discharge Summary  Melissa Ward ZOX:[id number] DOB: 1951/06/28 DOA: 01/26/2022  PCP: Pcp, No  Admit date: 01/26/2022 Discharge date: 01/27/2022  Admitted From: Home Disposition: Home  Recommendations for Outpatient Follow-up:  Follow up with PCP in 1-2 weeks Lisinopril discontinued due to persistent cough likely related to ACE inhibitor Started on losartan  Home Health: No Equipment/Devices: None  Discharge Condition: Stable CODE STATUS: Full code Diet recommendation: Heart healthy diet  History of present illness:  Melissa Ward is a 71 year old female with past medical history significant for HTN, HLD, GERD, OSA not on CPAP who presented to [facility] ED on 6/6 complaining of cough, neck pain and headache.  Patient reports suddenly began to experience a headache that was severe in intensity, sharp without any alleviating or exacerbating factors.  Patient also reports some associated neck pain and stiffness over this timeframe.  Denies fevers.  No recent sick contacts or focal weakness.  No slurred speech of loss of balance.  In the ED, temperature 103.1 F, HR 114, RR 20, BP 169/119, SPO2 97% on room air.  WBC 7.4, hemoglobin 12.4, platelets 221.  Sodium 138, potassium 3.4, chloride 104, CO2 22, glucose 91, BUN 8, creatinine 0.97.  AST 19, ALT 10, total bilirubin 0.9.  Lactic acid 1.8.  Procalcitonin less than 0.10.  COVID-19 PCR positive.  Urinalysis with negative leukocytes, negative nitrite, no bacteria.  CT head without contrast with no acute intracranial abnormality, remote lacunar infarct right basal ganglia.  CT angiogram chest with no evidence of pulm embolism, no acute abnormality noted.  Offered lumbar puncture due to elevated temperature and headache/neck stiffness but patient refused.  Patient was started on empiric antibiotics for concern of possible meningitis with vancomycin, ceftriaxone, acyclovir, ampicillin.  Hospital service consulted for further evaluation  management.   Hospital course:  Headache Patient presenting to ED with headache, neck stiffness.  Initially febrile to 103.1 on admission.  Head without contrast with no acute intracranial abnormality.  Patient refused lumbar puncture and was initially started on antibiotics with vancomycin, ampicillin, ceftriaxone, and acyclovir.  Patient was without leukocytosis, lactic acid within normal limits, and procalcitonin normal.  Etiology of her symptoms likely related to COVID-19 viral infection and unlikely meningitis.  Antibiotics were discontinued.  Patient was afebrile at time of discharge.  Supportive care, Tylenol as needed.  COVID-19 viral infection COVID-19 PCR positive on admission.  Chest x-ray and CT angiogram chest with no acute findings.  Oxygenating well on room air.  Patient offered Paxlovid, declined.  Continue supportive care.  Outpatient follow-up PCP.  Cough Patient reports onset while taking lisinopril.  Lisinopril discontinued.  Started on losartan.  Outpatient follow-up PCP.  Hypokalemia Repleted during hospitalization.  Essential hypertension Patient was recently restarted on lisinopril 10 mg p.o. daily, has had issues with this in the past causing cough which has now been recurrent.  Lisinopril discontinued.  Started on losartan.  Outpatient follow-up with PCP.  Hyperlipidemia Continue Crestor 20 mg p.o. daily, but reported as not taking.  Outpatient follow-up with PCP.  OSA Not on CPAP outpatient.  Follow-up with PCP.  Discharge Diagnoses:  Principal Problem:   Meningismus Active Problems:   Fever and chills   Cough due to ACE inhibitor   Hypokalemia   Essential hypertension   Mixed hyperlipidemia   Obstructive sleep apnea   GERD without esophagitis    Discharge Instructions  Discharge Instructions     Call MD for:  difficulty breathing, headache or visual disturbances   Complete by: As  directed    Call MD for:  extreme fatigue   Complete by: As  directed    Call MD for:  persistant dizziness or light-headedness   Complete by: As directed    Call MD for:  persistant nausea and vomiting   Complete by: As directed    Call MD for:  severe uncontrolled pain   Complete by: As directed    Call MD for:  temperature >100.4   Complete by: As directed    Diet - low sodium heart healthy   Complete by: As directed    Increase activity slowly   Complete by: As directed       Allergies as of 01/27/2022   No Known Allergies      Medication List     STOP taking these medications    azithromycin 500 MG tablet Commonly known as: ZITHROMAX   lisinopril 10 MG tablet Commonly known as: ZESTRIL   predniSONE 20 MG tablet Commonly known as: DELTASONE       TAKE these medications    CINNAMON PO Take 1 tablet by mouth in the morning and at bedtime.   clobetasol cream 0.05 % Commonly known as: TEMOVATE Apply topically.   GINGER PO Take 1 tablet by mouth daily.   losartan 25 MG tablet Commonly known as: COZAAR Take 1 tablet (25 mg total) by mouth daily.   omeprazole 40 MG capsule Commonly known as: PRILOSEC Take by mouth.   OVER THE COUNTER MEDICATION Take 1 tablet by mouth in the morning and at bedtime. Tumeric po   rosuvastatin 20 MG tablet Commonly known as: CRESTOR Take by mouth.   triamcinolone cream 0.1 % Commonly known as: KENALOG Apply topically.   valACYclovir 1000 MG tablet Commonly known as: VALTREX Take by mouth.        No Known Allergies  Consultations: None   Procedures/Studies: CT HEAD WO CONTRAST ( )  Result Date: 01/26/2022 CLINICAL DATA:  Headache, sudden, severe EXAM: CT HEAD WITHOUT CONTRAST TECHNIQUE: Contiguous axial images were obtained from the base of the skull through the vertex without intravenous contrast. RADIATION DOSE REDUCTION: This exam was performed according to the departmental dose-optimization program which includes automated exposure control, adjustment of the mA  and/or kV according to patient size and/or use of iterative reconstruction technique. COMPARISON:  None Available. FINDINGS: Brain: Brain volume is normal for age. No intracranial hemorrhage, mass effect, or midline shift. No hydrocephalus. The basilar cisterns are patent. Remote lacunar infarct in the right basal ganglia. Mild periventricular white matter hypodensity typical of chronic small vessel ischemia. No evidence of territorial infarct or acute ischemia. No extra-axial or intracranial fluid collection. Vascular: Atherosclerosis of skullbase vasculature without hyperdense vessel or abnormal calcification. Skull: No fracture or focal lesion. Sinuses/Orbits: Paranasal sinuses and mastoid air cells are clear. The visualized orbits are unremarkable. Other: None. IMPRESSION: 1. No acute intracranial abnormality. 2. Remote lacunar infarct in the right basal ganglia. Mild chronic small vessel ischemia. Electronically Signed   By: Narda Rutherford M.D.   On: 01/26/2022 19:40   CT Angio Chest PE W and/or Wo Contrast  Result Date: 01/26/2022 CLINICAL DATA:  Chest pain and elevated D-dimer, initial encounter EXAM: CT ANGIOGRAPHY CHEST WITH CONTRAST TECHNIQUE: Multidetector CT imaging of the chest was performed using the standard protocol during bolus administration of intravenous contrast. Multiplanar CT image reconstructions and MIPs were obtained to evaluate the vascular anatomy. RADIATION DOSE REDUCTION: This exam was performed according to the departmental dose-optimization program which includes automated  exposure control, adjustment of the mA and/or kV according to patient size and/or use of iterative reconstruction technique. CONTRAST:  65mL OMNIPAQUE IOHEXOL 350 MG/ML SOLN COMPARISON:  Chest x-ray from earlier in the same day. FINDINGS: Cardiovascular: Thoracic aorta and its branches are within normal limits. No dissection or aneurysmal dilatation is seen. No cardiac enlargement is noted. No coronary  calcifications are seen. The pulmonary artery shows a normal branching pattern bilaterally. No filling defect to suggest pulmonary embolism is noted. Mediastinum/Nodes: Thoracic inlet is within normal limits. No hilar or mediastinal adenopathy is noted. The esophagus as visualized is within normal limits. Lungs/Pleura: Lungs are well aerated bilaterally. No focal infiltrate or sizable effusion is seen. Upper Abdomen: Gallbladder has been surgically removed. The remainder of the upper abdomen appears within normal limits. Musculoskeletal: Degenerative changes of the thoracic spine are noted. Review of the MIP images confirms the above findings. IMPRESSION: No evidence of pulmonary emboli. No acute abnormality seen. Electronically Signed   By: Alcide Clever M.D.   On: 01/26/2022 21:44   DG Chest Portable 1 View  Result Date: 01/26/2022 CLINICAL DATA:  cough EXAM: PORTABLE CHEST 1 VIEW COMPARISON:  None Available. FINDINGS: Prominent cardiac silhouette likely due to AP portable technique. The heart and mediastinal contours are within normal limits. No focal consolidation. No pulmonary edema. No pleural effusion. No pneumothorax. No acute osseous abnormality. IMPRESSION: No active disease. Electronically Signed   By: Tish Frederickson M.D.   On: 01/26/2022 17:36     Subjective: Patient seen examined bedside, resting comfortably.  No specific complaints this morning.  Headache much improved.  Discussed with patient that her symptoms likely related to COVID-19 viral infection and very less likely meningitis.  Patient offered Paxlovid, declines.  Discussed with patient transitioning from lisinopril to losartan due to cough and agrees.  Ready for discharge home.  No other complaints or concerns at this time.  Denies nausea/vomiting/diarrhea, no fever/chills/night sweats currently, no chest pain, no palpitations, no shortness of breath, no abdominal pain, no focal weakness, no fatigue, no congestion, no paresthesias.  No  acute events overnight per nursing staff.  Discharge Exam: Vitals:   01/27/22 0415 01/27/22 0500  BP: (!) 149/64 137/77  Pulse: 82 77  Resp: 18   Temp:    SpO2: 93% 95%   Vitals:   01/27/22 0327 01/27/22 0330 01/27/22 0415 01/27/22 0500  BP:  (!) 141/85 (!) 149/64 137/77  Pulse:  73 82 77  Resp:   18   Temp: 98.6 F (37 C)     TempSrc: Oral     SpO2:  94% 93% 95%  Weight:      Height:        Physical Exam: GEN: NAD, alert and oriented x 3, wd/wn HEENT: NCAT, PERRL, EOMI, sclera clear, MMM PULM: CTAB w/o wheezes/crackles, normal respiratory effort, on room air CV: RRR w/o M/G/R GI: abd soft, NTND, NABS, no R/G/M MSK: no peripheral edema, muscle strength globally intact 5/5 bilateral upper/lower extremities NEURO: CN II-XII intact, no focal deficits, sensation to light touch intact, no meningismus PSYCH: normal mood/affect Integumentary: dry/intact, no rashes or wounds    The results of significant diagnostics from this hospitalization (including imaging, microbiology, ancillary and laboratory) are listed below for reference.     Microbiology: Recent Results (from the past 240 hour(s))  Resp Panel by RT-PCR (Flu A&B, Covid) Anterior Nasal Swab     Status: Abnormal   Collection Time: 01/26/22 10:58 PM   Specimen: Anterior Nasal Swab  Result Value Ref Range Status   SARS Coronavirus 2 by RT PCR POSITIVE (A) NEGATIVE Final    Comment: (NOTE) SARS-CoV-2 target nucleic acids are DETECTED.  The SARS-CoV-2 RNA is generally detectable in upper respiratory specimens during the acute phase of infection. Positive results are indicative of the presence of the identified virus, but do not rule out bacterial infection or co-infection with other pathogens not detected by the test. Clinical correlation with patient history and other diagnostic information is necessary to determine patient infection status. The expected result is Negative.  Fact Sheet for  Patients: BloggerCourse.com  Fact Sheet for Healthcare Providers: SeriousBroker.it  This test is not yet approved or cleared by the [place] FDA and  has been authorized for detection and/or diagnosis of SARS-CoV-2 by FDA under an Emergency Use Authorization (EUA).  This EUA will remain in effect (meaning this test can be used) for the duration of  the COVID-19 declaration under Section 564(b)(1) of the A ct, 21 U.S.C. section 360bbb-3(b)(1), unless the authorization is terminated or revoked sooner.     Influenza A by PCR NEGATIVE NEGATIVE Final   Influenza B by PCR NEGATIVE NEGATIVE Final    Comment: (NOTE) The Xpert Xpress SARS-CoV-2/FLU/RSV plus assay is intended as an aid in the diagnosis of influenza from Nasopharyngeal swab specimens and should not be used as a sole basis for treatment. Nasal washings and aspirates are unacceptable for Xpert Xpress SARS-CoV-2/FLU/RSV testing.  Fact Sheet for Patients: BloggerCourse.com  Fact Sheet for Healthcare Providers: SeriousBroker.it  This test is not yet approved or cleared by the [place] FDA and has been authorized for detection and/or diagnosis of SARS-CoV-2 by FDA under an Emergency Use Authorization (EUA). This EUA will remain in effect (meaning this test can be used) for the duration of the COVID-19 declaration under Section 564(b)(1) of the Act, 21 U.S.C. section 360bbb-3(b)(1), unless the authorization is terminated or revoked.  Performed at [facility], [address], [place], [place] [address]   MRSA Next Gen by PCR, Nasal     Status: None   Collection Time: 01/27/22  5:45 AM   Specimen: Nasal Mucosa; Nasal Swab  Result Value Ref Range Status   MRSA by PCR Next Gen NOT DETECTED NOT DETECTED Final    Comment: (NOTE) The GeneXpert MRSA Assay (FDA approved for NASAL specimens only), is one component of  a comprehensive MRSA colonization surveillance program. It is not intended to diagnose MRSA infection nor to guide or monitor treatment for MRSA infections. Test performance is not FDA approved in patients less than 3 years old. Performed at [facility], [address] [place], [place] [address]      Labs: BNP (last 3 results) No results for input(s): BNP in the last 8760 hours. Basic Metabolic Panel: Recent Labs  Lab 01/26/22 1752 01/27/22 0310  NA 138 137  K 3.4* 3.5  CL 104 103  CO2 22 25  GLUCOSE 91 166*  BUN 8 12  CREATININE 0.97 0.91  CALCIUM 9.2 9.0  MG  --  1.7   Liver Function Tests: Recent Labs  Lab 01/26/22 1752 01/27/22 0310  AST 19 17  ALT 10 11  ALKPHOS 68 57  BILITOT 0.9 0.7  PROT 7.2 6.1*  ALBUMIN 4.0 3.3*   No results for input(s): LIPASE, AMYLASE in the last 168 hours. No results for input(s): AMMONIA in the last 168 hours. CBC: Recent Labs  Lab 01/26/22 1752 01/27/22 0310  WBC 7.4  6.2  NEUTROABS 6.1 5.6  HGB 12.4 10.9*  HCT 39.5 34.4*  MCV 85.1 83.9  PLT 221 189   Cardiac Enzymes: No results for input(s): CKTOTAL, CKMB, CKMBINDEX, TROPONINI in the last 168 hours. BNP: Invalid input(s): POCBNP CBG: No results for input(s): GLUCAP in the last 168 hours. D-Dimer Recent Labs    01/26/22 1752  DDIMER 0.90*   Hgb A1c No results for input(s): HGBA1C in the last 72 hours. Lipid Profile No results for input(s): CHOL, HDL, LDLCALC, TRIG, CHOLHDL, LDLDIRECT in the last 72 hours. Thyroid function studies No results for input(s): TSH, T4TOTAL, T3FREE, THYROIDAB in the last 72 hours.  Invalid input(s): FREET3 Anemia work up No results for input(s): VITAMINB12, FOLATE, FERRITIN, TIBC, IRON, RETICCTPCT in the last 72 hours. Urinalysis    Component Value Date/Time   COLORURINE STRAW (A) 01/26/2022 2247   APPEARANCEUR CLEAR 01/26/2022 2247   LABSPEC 1.031 (H) 01/26/2022 2247   PHURINE 5.0 01/26/2022 2247   GLUCOSEU NEGATIVE  01/26/2022 2247   HGBUR MODERATE (A) 01/26/2022 2247   BILIRUBINUR NEGATIVE 01/26/2022 2247   KETONESUR NEGATIVE 01/26/2022 2247   PROTEINUR NEGATIVE 01/26/2022 2247   NITRITE NEGATIVE 01/26/2022 2247   LEUKOCYTESUR NEGATIVE 01/26/2022 2247   Sepsis Labs Invalid input(s): PROCALCITONIN,  WBC,  LACTICIDVEN Microbiology Recent Results (from the past 240 hour(s))  Resp Panel by RT-PCR (Flu A&B, Covid) Anterior Nasal Swab     Status: Abnormal   Collection Time: 01/26/22 10:58 PM   Specimen: Anterior Nasal Swab  Result Value Ref Range Status   SARS Coronavirus 2 by RT PCR POSITIVE (A) NEGATIVE Final    Comment: (NOTE) SARS-CoV-2 target nucleic acids are DETECTED.  The SARS-CoV-2 RNA is generally detectable in upper respiratory specimens during the acute phase of infection. Positive results are indicative of the presence of the identified virus, but do not rule out bacterial infection or co-infection with other pathogens not detected by the test. Clinical correlation with patient history and other diagnostic information is necessary to determine patient infection status. The expected result is Negative.  Fact Sheet for Patients: BloggerCourse.com  Fact Sheet for Healthcare Providers: SeriousBroker.it  This test is not yet approved or cleared by the [place] FDA and  has been authorized for detection and/or diagnosis of SARS-CoV-2 by FDA under an Emergency Use Authorization (EUA).  This EUA will remain in effect (meaning this test can be used) for the duration of  the COVID-19 declaration under Section 564(b)(1) of the A ct, 21 U.S.C. section 360bbb-3(b)(1), unless the authorization is terminated or revoked sooner.     Influenza A by PCR NEGATIVE NEGATIVE Final   Influenza B by PCR NEGATIVE NEGATIVE Final    Comment: (NOTE) The Xpert Xpress SARS-CoV-2/FLU/RSV plus assay is intended as an aid in the diagnosis of influenza  from Nasopharyngeal swab specimens and should not be used as a sole basis for treatment. Nasal washings and aspirates are unacceptable for Xpert Xpress SARS-CoV-2/FLU/RSV testing.  Fact Sheet for Patients: BloggerCourse.com  Fact Sheet for Healthcare Providers: SeriousBroker.it  This test is not yet approved or cleared by the [place] FDA and has been authorized for detection and/or diagnosis of SARS-CoV-2 by FDA under an Emergency Use Authorization (EUA). This EUA will remain in effect (meaning this test can be used) for the duration of the COVID-19 declaration under Section 564(b)(1) of the Act, 21 U.S.C. section 360bbb-3(b)(1), unless the authorization is terminated or revoked.  Performed at [facility], [address]  [place], [place] [address]   MRSA Next Gen by PCR, Nasal     Status: None   Collection Time: 01/27/22  5:45 AM   Specimen: Nasal Mucosa; Nasal Swab  Result Value Ref Range Status   MRSA by PCR Next Gen NOT DETECTED NOT DETECTED Final    Comment: (NOTE) The GeneXpert MRSA Assay (FDA approved for NASAL specimens only), is one component of a comprehensive MRSA colonization surveillance program. It is not intended to diagnose MRSA infection nor to guide or monitor treatment for MRSA infections. Test performance is not FDA approved in patients less than 71 years old. Performed at [facility], [address] [place], [place] [address]      Time coordinating discharge: Over 30 minutes  SIGNED:   Alvira PhilipsEric J [place], DO  [facility] 01/27/2022, 8:28 AM

## 2022-01-27 NOTE — Evaluation (Signed)
Occupational Therapy Evaluation Patient Details Name: Melissa Ward MRN: [id number] DOB: [birth year] Today's Date: 01/27/2022   History of Present Illness 71 y.o. F admitted on 01/26/22 due to a sharp headache and neck pain/stiffness. CT showed  remote lacunar infarct right basal ganglia. Found to be Covid +. PMH significant for HTN, prediabetes, OSA, IBS, and Low back pain.   Clinical Impression   Pt admitted for concerns listed above.PTA pt reported that she was independent with all ADL's and IADL's, including driving and working. At this time, pt presents with continued independence. She is able to complete BADL's and functional mobility with no AD. Pt has no further skilled OT needs at this time and acute OT will sign off.       Recommendations for follow up therapy are one component of a multi-disciplinary discharge planning process, led by the attending physician.  Recommendations may be updated based on patient status, additional functional criteria and insurance authorization.   Follow Up Recommendations  No OT follow up    Assistance Recommended at Discharge PRN  Patient can return home with the following      Functional Status Assessment  Patient has had a recent decline in their functional status and demonstrates the ability to make significant improvements in function in a reasonable and predictable amount of time.  Equipment Recommendations  None recommended by OT    Recommendations for Other Services       Precautions / Restrictions Precautions Precautions: None Restrictions Weight Bearing Restrictions: No      Mobility Bed Mobility Overal bed mobility: Modified Independent                  Transfers Overall transfer level: Modified independent Equipment used: None                      Balance Overall balance assessment: Mild deficits observed, not formally tested                                         ADL either performed or  assessed with clinical judgement   ADL Overall ADL's : Modified independent                                       General ADL Comments: Pt able to complete ADL's with no concerns     Vision Baseline Vision/History: 1 Wears glasses Ability to See in Adequate Light: 0 Adequate Patient Visual Report: No change from baseline Vision Assessment?: No apparent visual deficits     Perception     Praxis      Pertinent Vitals/Pain Pain Assessment Pain Assessment: No/denies pain     Hand Dominance Right   Extremity/Trunk Assessment Upper Extremity Assessment Upper Extremity Assessment: Overall WFL for tasks assessed   Lower Extremity Assessment Lower Extremity Assessment: Overall WFL for tasks assessed   Cervical / Trunk Assessment Cervical / Trunk Assessment: Normal   Communication Communication Communication: No difficulties   Cognition Arousal/Alertness: Awake/alert Behavior During Therapy: WFL for tasks assessed/performed Overall Cognitive Status: Within Functional Limits for tasks assessed  General Comments  VSS on RA    Exercises     Shoulder Instructions      Home Living Family/patient expects to be discharged to:: Private residence Living Arrangements: Other relatives (granddaughter) Available Help at Discharge: Family Type of Home: House Home Access: Stairs to enter Technical brewer of Steps: 8-10 stairs Entrance Stairs-Rails: Can reach both Home Layout: One level     Bathroom Shower/Tub: Teacher, early years/pre: Handicapped height     Home Equipment: None          Prior Functioning/Environment Prior Level of Function : Independent/Modified Independent;Working/employed;Driving             Mobility Comments: No AD ADLs Comments: Works aas a Psychologist, 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        OT Problem List: Decreased strength;Decreased activity  tolerance;Cardiopulmonary status limiting activity      OT Treatment/Interventions:      OT Goals(Current goals can be found in the care plan section) Acute Rehab OT Goals Patient Stated Goal: To get better and return home OT Goal Formulation: All assessment and education complete, DC therapy Time For Goal Achievement: 01/27/22 Potential to Achieve Goals: Good  OT Frequency:      Co-evaluation              AM-PAC OT "6 Clicks" Daily Activity     Outcome Measure Help from another person eating meals?: None Help from another person taking care of personal grooming?: None Help from another person toileting, which includes using toliet, bedpan, or urinal?: None Help from another person bathing (including washing, rinsing, drying)?: None Help from another person to put on and taking off regular upper body clothing?: None Help from another person to put on and taking off regular lower body clothing?: None 6 Click Score: 24   End of Session Nurse Communication: Mobility status  Activity Tolerance: Patient tolerated treatment well Patient left: in bed;with call bell/phone within reach  OT Visit Diagnosis: Unsteadiness on feet (R26.81);Other abnormalities of gait and mobility (R26.89);Muscle weakness (generalized) (M62.81)                Time: [id number] OT Time Calculation (min): 10 min Charges:  OT General Charges $OT Visit: 1 Visit OT Evaluation $OT Eval Low Complexity: Raymond., OTR/L Acute Rehabilitation  Romeka Scifres Elane Yolanda Bonine 01/27/2022, 11:17 AM

## 2022-01-28 LAB — URINE CULTURE: Culture: 10000 — AB

## 2022-01-29 ENCOUNTER — Telehealth: Payer: Self-pay | Admitting: *Deleted

## 2022-01-29 NOTE — Progress Notes (Signed)
ED Antimicrobial Stewardship Positive Culture Follow Up   Melissa Ward is an 71 y.o. female who presented to [facility] on 01/26/2022 with a chief complaint of  Chief Complaint  Patient presents with   Neck Pain   Headache    Recent Results (from the past 720 hour(s))  Blood Culture (routine x 2)     Status: None (Preliminary result)   Collection Time: 01/26/22  5:52 PM   Specimen: BLOOD  Result Value Ref Range Status   Specimen Description BLOOD RIGHT ANTECUBITAL  Final   Special Requests   Final    BOTTLES DRAWN AEROBIC AND ANAEROBIC Blood Culture adequate volume   Culture   Final    NO GROWTH 2 DAYS Performed at [facility], [address], [place], [place] [address]    Report Status PENDING  Incomplete  Blood Culture (routine x 2)     Status: None (Preliminary result)   Collection Time: 01/26/22  6:00 PM   Specimen: BLOOD  Result Value Ref Range Status   Specimen Description BLOOD BLOOD RIGHT HAND  Final   Special Requests   Final    BOTTLES DRAWN AEROBIC AND ANAEROBIC Blood Culture results may not be optimal due to an inadequate volume of blood received in culture bottles   Culture   Final    NO GROWTH 2 DAYS Performed at [facility], [address] [place], [place] [address]    Report Status PENDING  Incomplete  Urine Culture     Status: Abnormal   Collection Time: 01/26/22 10:47 PM   Specimen: In/Out Cath Urine  Result Value Ref Range Status   Specimen Description IN/OUT CATH URINE  Final   Special Requests NONE  Final   Culture (A)  Final    10,000 COLONIES/mL STAPHYLOCOCCUS AUREUS WITHIN MIXED ORGANISMS Performed at [facility], [address], [place], [place] [address]    Report Status 01/28/2022 FINAL  Final   Organism ID, Bacteria STAPHYLOCOCCUS AUREUS (A)  Final      Susceptibility   Staphylococcus aureus - MIC*    CIPROFLOXACIN <=0.5 SENSITIVE Sensitive     GENTAMICIN <=0.5 SENSITIVE Sensitive     NITROFURANTOIN <=16 SENSITIVE  Sensitive     OXACILLIN <=0.25 SENSITIVE Sensitive     TETRACYCLINE <=1 SENSITIVE Sensitive     VANCOMYCIN 1 SENSITIVE Sensitive     TRIMETH/SULFA <=10 SENSITIVE Sensitive     CLINDAMYCIN RESISTANT Resistant     RIFAMPIN <=0.5 SENSITIVE Sensitive     Inducible Clindamycin POSITIVE Resistant     * 10,000 COLONIES/mL STAPHYLOCOCCUS AUREUS  Resp Panel by RT-PCR (Flu A&B, Covid) Anterior Nasal Swab     Status: Abnormal   Collection Time: 01/26/22 10:58 PM   Specimen: Anterior Nasal Swab  Result Value Ref Range Status   SARS Coronavirus 2 by RT PCR POSITIVE (A) NEGATIVE Final    Comment: (NOTE) SARS-CoV-2 target nucleic acids are DETECTED.  The SARS-CoV-2 RNA is generally detectable in upper respiratory specimens during the acute phase of infection. Positive results are indicative of the presence of the identified virus, but do not rule out bacterial infection or co-infection with other pathogens not detected by the test. Clinical correlation with patient history and other diagnostic information is necessary to determine patient infection status. The expected result is Negative.  Fact Sheet for Patients: BloggerCourse.com  Fact Sheet for Healthcare Providers: SeriousBroker.it  This test is not yet approved or cleared by the [place] and  has been authorized for detection and/or diagnosis of SARS-CoV-2 by FDA under an Emergency Use Authorization (EUA).  This EUA will remain in effect (meaning this test can be used) for the duration of  the COVID-19 declaration under Section 564(b)(1) of the A ct, 21 U.S.C. section 360bbb-3(b)(1), unless the authorization is terminated or revoked sooner.     Influenza A by PCR NEGATIVE NEGATIVE Final   Influenza B by PCR NEGATIVE NEGATIVE Final    Comment: (NOTE) The Xpert Xpress SARS-CoV-2/FLU/RSV plus assay is intended as an aid in the diagnosis of influenza from Nasopharyngeal swab  specimens and should not be used as a sole basis for treatment. Nasal washings and aspirates are unacceptable for Xpert Xpress SARS-CoV-2/FLU/RSV testing.  Fact Sheet for Patients: BloggerCourse.com  Fact Sheet for Healthcare Providers: SeriousBroker.it  This test is not yet approved or cleared by the [place] FDA and has been authorized for detection and/or diagnosis of SARS-CoV-2 by FDA under an Emergency Use Authorization (EUA). This EUA will remain in effect (meaning this test can be used) for the duration of the COVID-19 declaration under Section 564(b)(1) of the Act, 21 U.S.C. section 360bbb-3(b)(1), unless the authorization is terminated or revoked.  Performed at [facility], [address] [place], [place] [address]   MRSA Next Gen by PCR, Nasal     Status: None   Collection Time: 01/27/22  5:45 AM   Specimen: Nasal Mucosa; Nasal Swab  Result Value Ref Range Status   MRSA by PCR Next Gen NOT DETECTED NOT DETECTED Final    Comment: (NOTE) The GeneXpert MRSA Assay (FDA approved for NASAL specimens only), is one component of a comprehensive MRSA colonization surveillance program. It is not intended to diagnose MRSA infection nor to guide or monitor treatment for MRSA infections. Test performance is not FDA approved in patients less than 55 years old. Performed at [facility], [address] [place], [place] [address]      Plan: PPRN will call patient and see how she is feeling. If any urinary symptoms, will f/u with PCP for repeat urine cultures.  ED Provider: Harlene Salts, PA-C   Christoper Fabian, PharmD, BCPS 01/29/2022, 8:50 AM Clinical Pharmacist Monday - Friday phone -  [phone] Saturday - Sunday phone - [phone]

## 2022-01-29 NOTE — Telephone Encounter (Signed)
Post ED Visit - Positive Culture Follow-up  Culture report reviewed by antimicrobial stewardship pharmacist: [facility] []  , Pharm.D. []  Enzo Bi, Pharm.D., BCPS AQ-ID []  , Pharm.D., BCPS []  Celedonio Miyamoto, Pharm.D., BCPS []  [place], Garvin Fila.D., BCPS, AAHIVP []  , Pharm.D., BCPS, AAHIVP []  Georgina Pillion, PharmD, BCPS []  , PharmD, BCPS []  [place], PharmD, BCPS []  [place], PharmD []  , PharmD, BCPS []  Estella Husk, PharmD  Pharmacy Team []  Lysle Pearl, PharmD []  , PharmD []  Phillips Climes, PharmD []  , Rph []  Agapito Games) , PharmD []  Verlan Friends, PharmD []  , PharmD []  Mervyn Gay, PharmD []  , PharmD []  Vinnie Level, PharmD []  [facility], PharmD []  , PharmD []  Len Childs, PharmD   Positive urine culture No current UTI symptoms.  Advised to follow up with PCP if UTI symptoms develop.   , PA-C  Greer Pickerel Harrison 01/29/2022, 9:22 AM

## 2022-01-31 LAB — CULTURE, BLOOD (ROUTINE X 2)
Culture: NO GROWTH
Culture: NO GROWTH
Special Requests: ADEQUATE

## 2022-12-22 DEATH — deceased

## 2023-06-29 IMAGING — CT CT ANGIO CHEST
2 of 6 series · 18 of 46 positions shown · IV contrast (agent unspecified)
Comparison: Chest x-ray from earlier in the same day.

CLINICAL DATA: Chest pain and elevated D-dimer, initial encounter

EXAM:
CT ANGIOGRAPHY CHEST WITH CONTRAST
TECHNIQUE: Multidetector CT imaging of the chest was performed using the
standard protocol during bolus administration of intravenous
contrast. Multiplanar CT image reconstructions and MIPs were
obtained to evaluate the vascular anatomy.

[Series 6: thins · axial · 0.96mm/px · z∈[-105,+152]mm · 15 of 283 slices shown]
[im 13/283  lung]
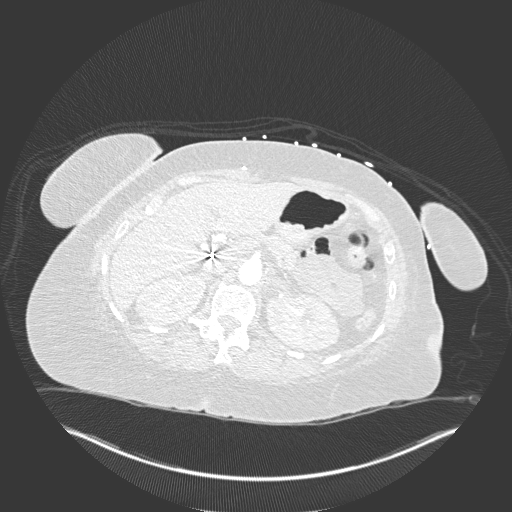
[im 37/283  soft-tissue]
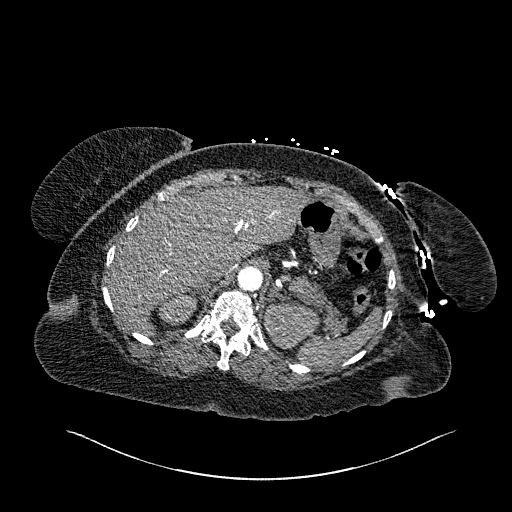
[im 50/283  lung]
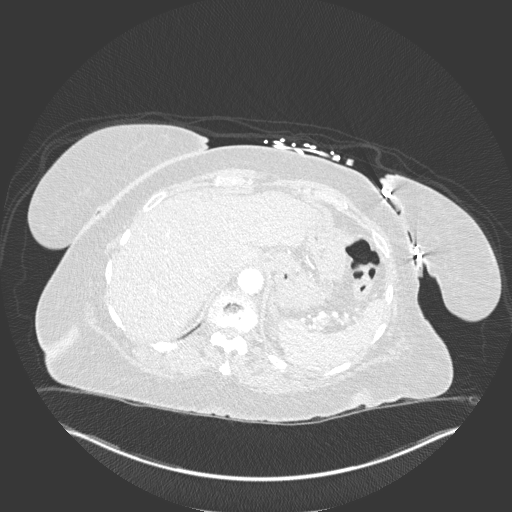
[im 74/283  soft-tissue]
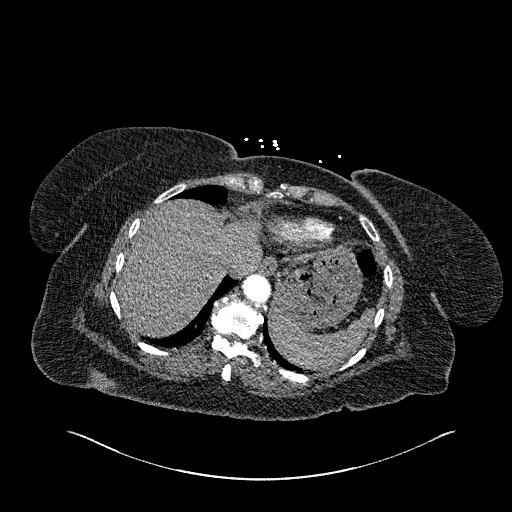
[im 86/283  lung]
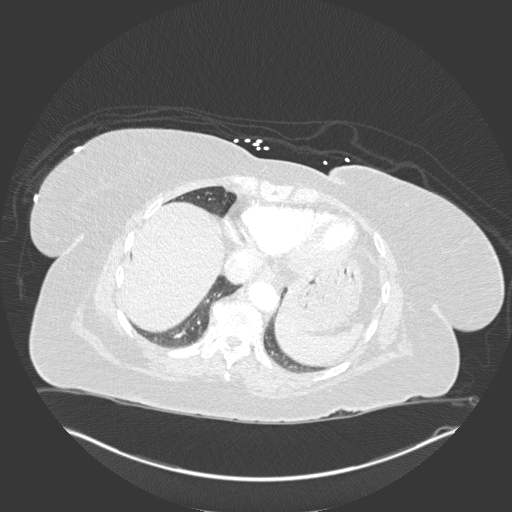
[im 111/283  soft-tissue]
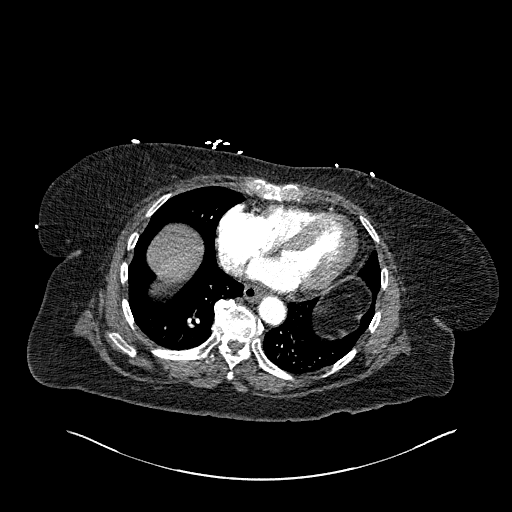
[im 123/283  lung]
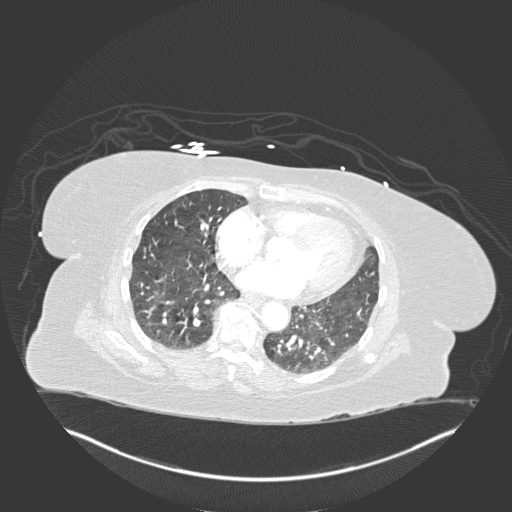
[im 148/283  soft-tissue]
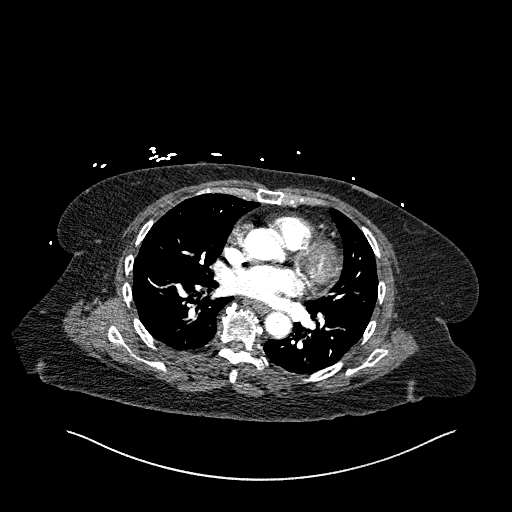
[im 160/283  lung]
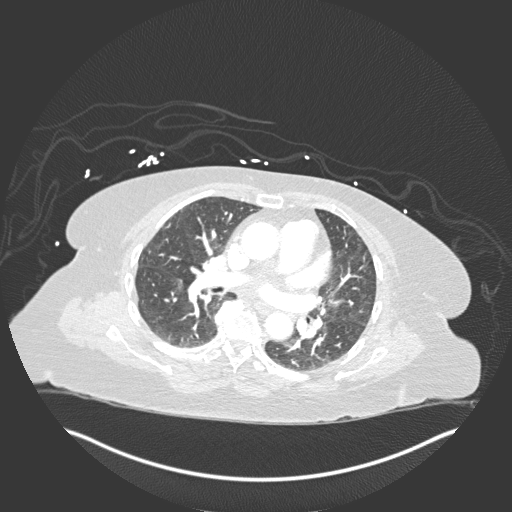
[im 172/283  soft-tissue]
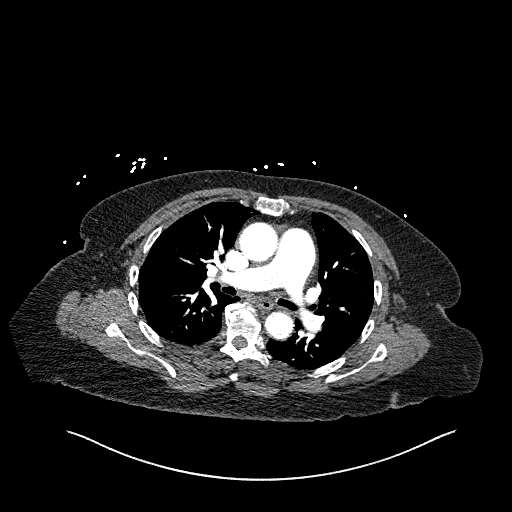
[im 197/283  lung]
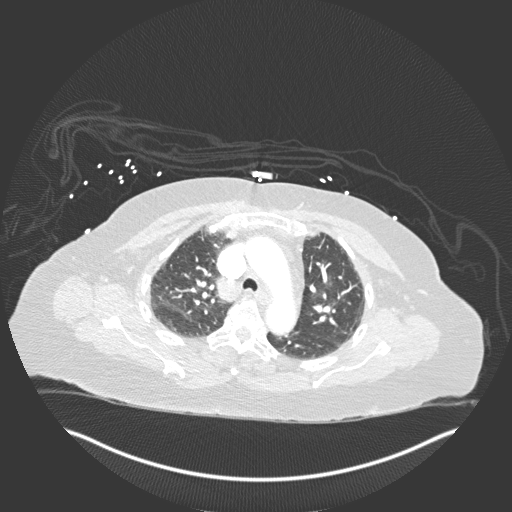
[im 209/283  soft-tissue]
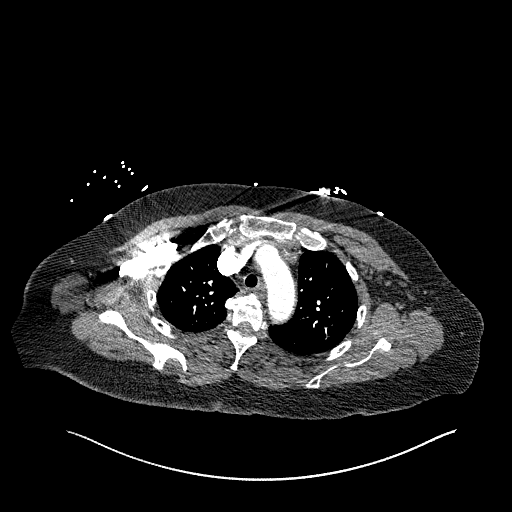
[im 233/283  lung]
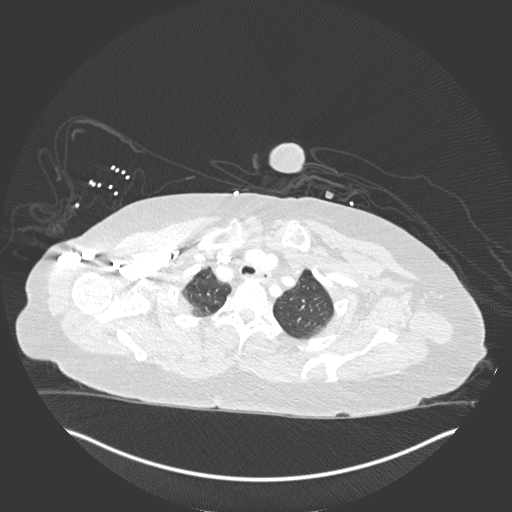
[im 246/283  soft-tissue]
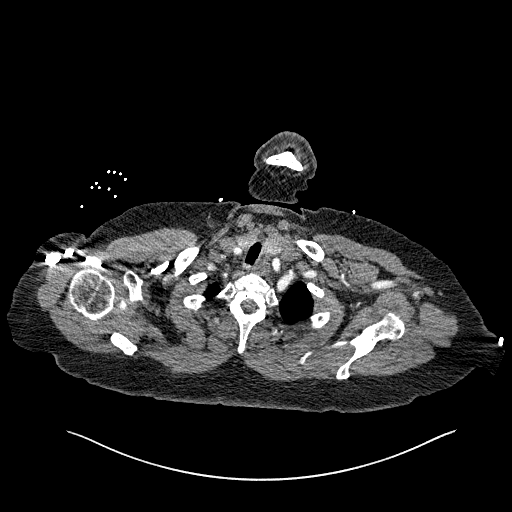
[im 270/283  lung]
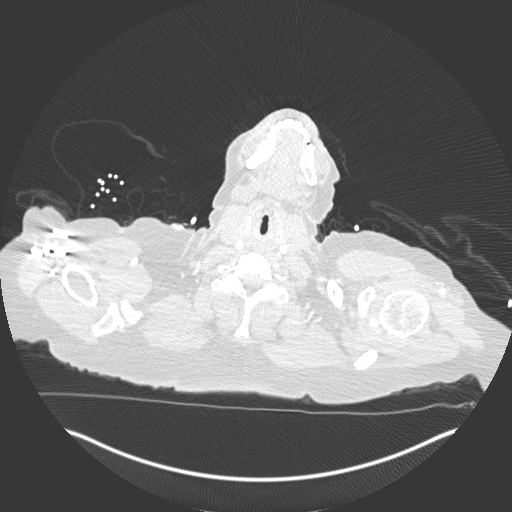

[Series 8: coronal mpr · coronal · 0.58mm/px · 3 of 151 slices shown]
[im 38/151  soft-tissue]
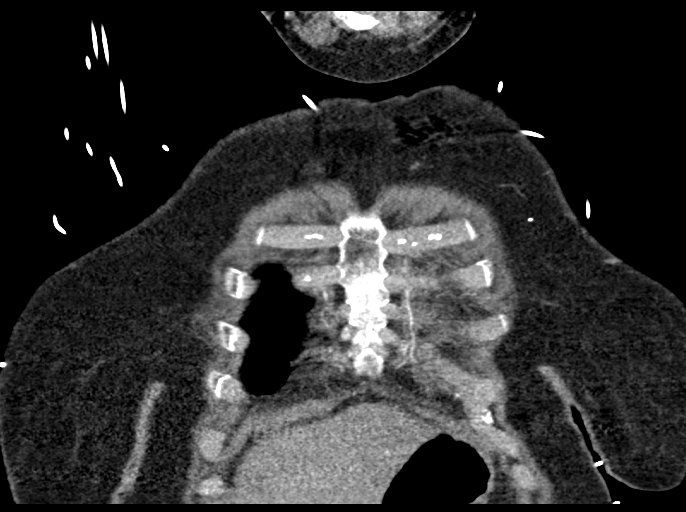
[im 76/151  soft-tissue]
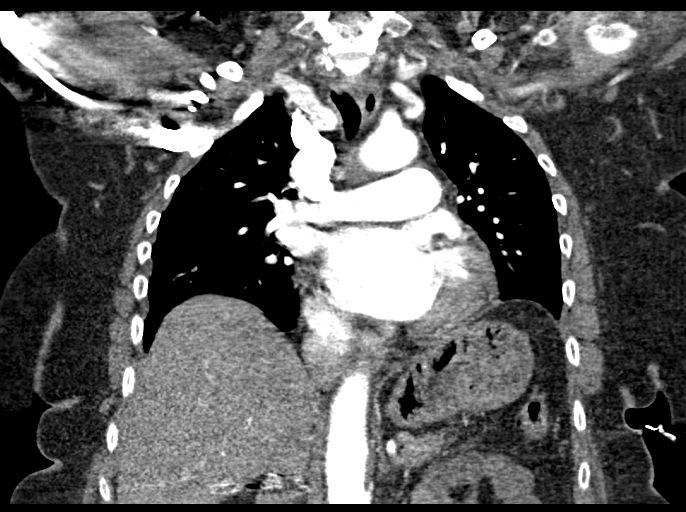
[im 113/151  soft-tissue]
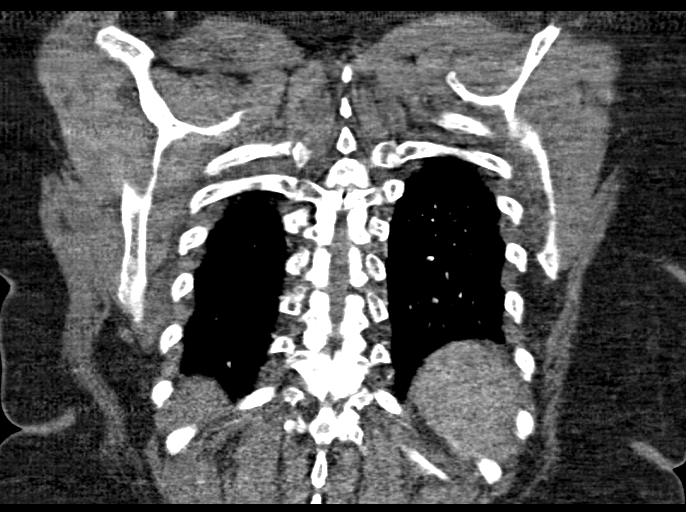

[18 of 46 positions shown; findings below may reference images not displayed]

RADIATION DOSE REDUCTION: This exam was performed according to the
departmental dose-optimization program which includes automated
exposure control, adjustment of the mA and/or kV according to
patient size and/or use of iterative reconstruction technique.

CONTRAST:  65mL OMNIPAQUE IOHEXOL 350 MG/ML SOLN
FINDINGS: Cardiovascular: Thoracic aorta and its branches are within normal
limits. No dissection or aneurysmal dilatation is seen. No cardiac
enlargement is noted. No coronary calcifications are seen. The
pulmonary artery shows a normal branching pattern bilaterally. No
filling defect to suggest pulmonary embolism is noted.

Mediastinum/Nodes: Thoracic inlet is within normal limits. No hilar
or mediastinal adenopathy is noted. The esophagus as visualized is
within normal limits.

Lungs/Pleura: Lungs are well aerated bilaterally. No focal
infiltrate or sizable effusion is seen.

Upper Abdomen: Gallbladder has been surgically removed. The
remainder of the upper abdomen appears within normal limits.

Musculoskeletal: Degenerative changes of the thoracic spine are
noted.

Review of the MIP images confirms the above findings.
IMPRESSION: No evidence of pulmonary emboli.

No acute abnormality seen.
